# Patient Record
Sex: Female | Born: 1980 | Race: Black or African American | Hispanic: No | Marital: Single | Smoking: Former smoker
Health system: Southern US, Community
[De-identification: ages and names within clinical notes are randomized; demographics above are authoritative.]

## PROBLEM LIST (undated history)

## (undated) DIAGNOSIS — D649 Anemia, unspecified: Secondary | ICD-10-CM

## (undated) DIAGNOSIS — M419 Scoliosis, unspecified: Secondary | ICD-10-CM

## (undated) DIAGNOSIS — M549 Dorsalgia, unspecified: Secondary | ICD-10-CM

## (undated) HISTORY — PX: DILATION AND CURETTAGE OF UTERUS: SHX78

---

## 2014-05-16 ENCOUNTER — Encounter: Payer: Self-pay | Admitting: *Deleted

## 2014-05-16 ENCOUNTER — Emergency Department (INDEPENDENT_AMBULATORY_CARE_PROVIDER_SITE_OTHER)
Admission: EM | Admit: 2014-05-16 | Discharge: 2014-05-16 | Disposition: A | Discharge status: Home / Self Care | Payer: Self-pay | Source: Home / Self Care | Attending: Family Medicine | Admitting: Family Medicine

## 2014-05-16 ENCOUNTER — Telehealth: Payer: Self-pay | Admitting: *Deleted

## 2014-05-16 DIAGNOSIS — L03031 Cellulitis of right toe: Secondary | ICD-10-CM

## 2014-05-16 MED ORDER — HYDROCODONE-ACETAMINOPHEN 5-325 MG PO TABS
1.0000 | ORAL_TABLET | Freq: Four times a day (QID) | ORAL | Status: DC | PRN
Start: 2014-05-16 — End: 2014-06-05

## 2014-05-16 MED ORDER — MELOXICAM 15 MG PO TABS: 15.0000 mg | ORAL_TABLET | Freq: Every day | ORAL | Status: DC

## 2014-05-16 MED ORDER — DOXYCYCLINE HYCLATE 100 MG PO CAPS: 100.0000 mg | ORAL_CAPSULE | Freq: Two times a day (BID) | ORAL | Status: DC

## 2014-05-16 NOTE — Discharge Instructions (Signed)
Begin warm soaks 3 to 4 times daily.  Elevate foot whenever possible.   Paronychia  Paronychia is an infection of the skin caused by germs. It happens by the fingernail or toenail. You can avoid it by not:  Pulling on hangnails.  Nail biting.  Thumb sucking.  Cutting fingernails and toenails too short.  Cutting the skin at the base and sides of the fingernail or toenail (cuticle). HOME CARE  Keep the fingers or toes very dry. Put rubber gloves over cotton gloves when putting hands in water.  Keep the wound clean and bandaged (dressed) as told by your doctor.  Soak the fingers or toes in warm water for 15 to 20 minutes. Soak them 3 to 4 times per day for germ infections. Fungal infections are difficult to treat. Fungal infections often require treatment for a long time.  Only take medicine as told by your doctor. GET HELP RIGHT AWAY IF:   You have redness, puffiness (swelling), or pain that gets worse.  You see yellowish-white fluid (pus) coming from the wound.  You have a fever.  You have a bad smell coming from the wound or bandage. MAKE SURE YOU:  Understand these instructions.  Will watch your condition.  Will get help if you are not doing well or get worse. Document Released: 05/28/2009 Document Revised: 09/01/2011 Document Reviewed: 05/28/2009 [facility] Patient Information 2015 [place], [place]. This information is not intended to replace advice given to you by your health care provider. Make sure you discuss any questions you have with your health care provider.

## 2014-05-16 NOTE — ED Notes (Signed)
[facility] [place]

## 2014-05-16 NOTE — ED Provider Notes (Signed)
CSN: [id number]     Arrival date & time 05/16/14  1426 History   First MD Initiated Contact with Patient 05/16/14 1447     Chief Complaint  Patient presents with  . Nail Problem      HPI Comments: Patient complains of a one week history of a painful "ingrown" right great toenail with pain and swelling.  She had similar condition one year ago treated by partial excision of her toenail.  Patient is a 33 y.o. female presenting with toe pain. The history is provided by the patient.  Toe Pain This is a recurrent problem. Episode onset: one week ago. The problem occurs constantly. The problem has been gradually worsening. Associated symptoms comments: No fever. The symptoms are aggravated by walking. Nothing relieves the symptoms. She has tried nothing for the symptoms.    History reviewed. No pertinent past medical history. Past Surgical History  Procedure Laterality Date  . Dilation and curettage of uterus     History reviewed. No pertinent family history. History  Substance Use Topics  . Smoking status: Former Smoker    Types: Cigarettes    Quit date: 12/14/2013  . Smokeless tobacco: Never Used  . Alcohol Use: No   OB History    No data available     Review of Systems  All other systems reviewed and are negative.   Allergies  Tramadol  Home Medications   Prior to Admission medications   Medication Sig Start Date End Date Taking? Authorizing Provider  doxycycline (VIBRAMYCIN) 100 MG capsule Take 1 capsule (100 mg total) by mouth 2 (two) times daily. (Take with food) 05/16/14   Lattie HawStephen A Aadin Gaut, MD  HYDROcodone-acetaminophen (NORCO/VICODIN) 5-325 MG per tablet Take 1 tablet by mouth every 6 (six) hours as needed. May take two tabs at bedtime if needed. 05/16/14   Lattie HawStephen A Jazz Biddy, MD  meloxicam (MOBIC) 15 MG tablet Take 1 tablet (15 mg total) by mouth daily. Take with food each evening 05/16/14   Lattie HawStephen A Katharine Rochefort, MD   BP 116/75 mmHg  Pulse 88  Temp(Src) 98.1 F (36.7 C)  (Oral)  Ht 5\' 4"  (1.626 m)  Wt 113 lb (51.256 kg)  BMI 19.39 kg/m2  SpO2 100%  LMP 05/09/2014 Physical Exam  Constitutional: She is oriented to person, place, and time. She appears well-developed and well-nourished. No distress.  HENT:  Head: Normocephalic.  Eyes: Conjunctivae are normal. Pupils are equal, round, and reactive to light.  Musculoskeletal:       Right foot: There is tenderness and swelling. There is normal range of motion, no bony tenderness and normal capillary refill.       Feet:  Patient's right great toe has tenderness and swelling at the lateral base of the toenail.  Toenail is thickened and somewhat deformed, but no swelling or tenderness at the distal edges of nail.  Neurological: She is alert and oriented to person, place, and time.  Skin: Skin is warm and dry. No rash noted.  Nursing note and vitals reviewed.   ED Course  Procedures  none     MDM   1. Paronychia of great toe, right.  Doubt ingrown toenail.  Suspect onychomycosis also   Begin doxycycline and Mobic.  Lortab for pain. Begin warm soaks 3 to 4 times daily.  Elevate foot whenever possible. Followup with podiatrist if not resolved in one week.  Addendum: Call back from patient: unable to afford doxycycline. Will switch to Keflex 500mg  TID.    Tera MaterStephen A  Cathren HarshBeese, MD 05/16/14 1659

## 2014-05-16 NOTE — ED Notes (Signed)
Eileen LittenLakinya c/o ingrown toenail to right great toe x 1 week. C/o pain and swelling. Hx of nail removal 1 year ago.

## 2014-06-05 ENCOUNTER — Emergency Department (HOSPITAL_COMMUNITY)
Admission: EM | Admit: 2014-06-05 | Discharge: 2014-06-05 | Disposition: A | Discharge status: Home / Self Care | Payer: Medicaid Other | Attending: Emergency Medicine | Admitting: Emergency Medicine

## 2014-06-05 ENCOUNTER — Encounter (HOSPITAL_COMMUNITY): Payer: Self-pay | Admitting: Emergency Medicine

## 2014-06-05 DIAGNOSIS — Z87891 Personal history of nicotine dependence: Secondary | ICD-10-CM | POA: Diagnosis not present

## 2014-06-05 DIAGNOSIS — K088 Other specified disorders of teeth and supporting structures: Secondary | ICD-10-CM | POA: Diagnosis present

## 2014-06-05 DIAGNOSIS — Z791 Long term (current) use of non-steroidal anti-inflammatories (NSAID): Secondary | ICD-10-CM | POA: Diagnosis not present

## 2014-06-05 DIAGNOSIS — Z792 Long term (current) use of antibiotics: Secondary | ICD-10-CM | POA: Diagnosis not present

## 2014-06-05 DIAGNOSIS — G479 Sleep disorder, unspecified: Secondary | ICD-10-CM | POA: Diagnosis not present

## 2014-06-05 DIAGNOSIS — K0889 Other specified disorders of teeth and supporting structures: Secondary | ICD-10-CM

## 2014-06-05 MED ORDER — HYDROCODONE-ACETAMINOPHEN 5-325 MG PO TABS: 1.0000 | ORAL_TABLET | Freq: Four times a day (QID) | ORAL | Status: DC | PRN

## 2014-06-05 MED ORDER — PENICILLIN V POTASSIUM 500 MG PO TABS
500.0000 mg | ORAL_TABLET | Freq: Four times a day (QID) | ORAL | Status: DC
Start: 2014-06-05 — End: 2014-07-21

## 2014-06-05 NOTE — ED Notes (Signed)
Patient has a broken right bottom tooth. Right lower gums swollen and has tenderness to the right lateral neck. Patient denies any fever, difficulty breathing , or swallowing.

## 2014-06-05 NOTE — ED Provider Notes (Signed)
CSN: [id number]     Arrival date & time 06/05/14  [id number] History   First MD Initiated Contact with Patient 06/05/14 0940     Chief Complaint  Patient presents with  . Dental Pain     (Consider location/radiation/quality/duration/timing/severity/associated sxs/prior Treatment) HPI Comments: Patient presents to the emergency department with a dental complaint. Symptoms began 3 days ago. The patient has tried to alleviate pain with OTC meds.  Pain rated at a 10/10, characterized as throbbing in nature and located right lower molar. Patient denies fever, night sweats, chills, difficulty swallowing or opening mouth, SOB, nuchal rigidity or decreased ROM of neck.  Patient does not have a dentist and requests a resource guide at discharge.   The history is provided by the patient. No language interpreter was used.    History reviewed. No pertinent past medical history. Past Surgical History  Procedure Laterality Date  . Dilation and curettage of uterus     No family history on file. History  Substance Use Topics  . Smoking status: Former Smoker    Types: Cigarettes    Quit date: 12/14/2013  . Smokeless tobacco: Never Used  . Alcohol Use: No   OB History    No data available     Review of Systems  Constitutional: Negative for chills.  HENT: Positive for dental problem.   Neurological: Negative for speech difficulty.  Psychiatric/Behavioral: Positive for sleep disturbance.      Allergies  Tramadol  Home Medications   Prior to Admission medications   Medication Sig Start Date End Date Taking? Authorizing Provider  doxycycline (VIBRAMYCIN) 100 MG capsule Take 1 capsule (100 mg total) by mouth 2 (two) times daily. (Take with food) 05/16/14   Lattie HawStephen A Beese, MD  HYDROcodone-acetaminophen (NORCO/VICODIN) 5-325 MG per tablet Take 1 tablet by mouth every 6 (six) hours as needed for moderate pain or severe pain. 06/05/14   Roxy Horsemanobert Treysean Petruzzi, PA-C  meloxicam (MOBIC) 15 MG tablet Take  1 tablet (15 mg total) by mouth daily. Take with food each evening 05/16/14   Lattie HawStephen A Beese, MD  penicillin v potassium (VEETID) 500 MG tablet Take 1 tablet (500 mg total) by mouth 4 (four) times daily. 06/05/14   Roxy Horsemanobert Nyanna Heideman, PA-C   BP 108/79 mmHg  Pulse 98  Temp(Src) 98 F (36.7 C) (Oral)  Resp 16  SpO2 100%  LMP 05/09/2014 Physical Exam  Constitutional: She is oriented to person, place, and time. She appears well-developed and well-nourished.  HENT:  Head: Normocephalic and atraumatic.  Mouth/Throat:    Poor dentition throughout.  Affected tooth as diagrammed.  No signs of peritonsillar or tonsillar abscess.  No signs of gingival abscess. Oropharynx is clear and without exudates.  Uvula is midline.  Airway is intact. No signs of Ludwig's angina with palpation of oral and sublingual mucosa.   Eyes: Conjunctivae and EOM are normal.  Neck: Normal range of motion.  Cardiovascular: Normal rate.   Pulmonary/Chest: Effort normal.  Abdominal: She exhibits no distension.  Musculoskeletal: Normal range of motion.  Neurological: She is alert and oriented to person, place, and time.  Skin: Skin is dry.  Psychiatric: She has a normal mood and affect. Her behavior is normal. Judgment and thought content normal.  Nursing note and vitals reviewed.   ED Course  Procedures (including critical care time) Labs Review Labs Reviewed - No data to display  Imaging Review No results found.   EKG Interpretation None      MDM   Final diagnoses:  Pain, dental    Patient with toothache.  No gross abscess.  Exam unconcerning for Ludwig's angina or spread of infection.  Will treat with penicillin and pain medicine.  Urged patient to follow-up with dentist.      Roxy Horsemanobert Duanne Duchesne, PA-C 06/05/14 1012  Arby BarretteMarcy Pfeiffer, MD 06/06/14 [phone]

## 2014-06-05 NOTE — Discharge Instructions (Signed)

## 2014-06-05 NOTE — ED Notes (Signed)
Pt c/o right sided dental pain, has broken tooth.

## 2014-07-17 ENCOUNTER — Encounter: Payer: Self-pay | Admitting: Emergency Medicine

## 2014-07-17 ENCOUNTER — Emergency Department
Admission: EM | Admit: 2014-07-17 | Discharge: 2014-07-17 | Disposition: A | Discharge status: Home / Self Care | Payer: Medicaid Other | Source: Home / Self Care | Attending: Family Medicine | Admitting: Family Medicine

## 2014-07-17 DIAGNOSIS — L03031 Cellulitis of right toe: Secondary | ICD-10-CM

## 2014-07-17 MED ORDER — DOXYCYCLINE HYCLATE 100 MG PO CAPS: 100.0000 mg | ORAL_CAPSULE | Freq: Two times a day (BID) | ORAL | Status: DC

## 2014-07-17 MED ORDER — HYDROCODONE-ACETAMINOPHEN 5-325 MG PO TABS: ORAL_TABLET | ORAL | Status: DC

## 2014-07-17 NOTE — ED Provider Notes (Signed)
CSN: [id number]     Arrival date & time 07/17/14  1647 History   First MD Initiated Contact with Patient 07/17/14 1708     Chief Complaint  Patient presents with  . Nail Problem     HPI Comments: Patient complains of infection and pain in her right great toe and toenail for about five days.  She has had drainage, and increasing pain at night.  No fevers, chills, and sweats   Patient is a 34 y.o. female presenting with toe pain. The history is provided by the patient.  Toe Pain This is a recurrent problem. Episode onset: 5 days ago. The problem occurs constantly. The problem has been gradually worsening. Associated symptoms comments: No fever. The symptoms are aggravated by walking. Nothing relieves the symptoms. Treatments tried: Ibuprofen. The treatment provided mild relief.    History reviewed. No pertinent past medical history. Past Surgical History  Procedure Laterality Date  . Dilation and curettage of uterus     No family history on file. History  Substance Use Topics  . Smoking status: Former Smoker    Types: Cigarettes    Quit date: 12/14/2013  . Smokeless tobacco: Never Used  . Alcohol Use: No   OB History    No data available     Review of Systems  All other systems reviewed and are negative.   Allergies  Tramadol  Home Medications   Prior to Admission medications   Medication Sig Start Date End Date Taking? Authorizing Provider  doxycycline (VIBRAMYCIN) 100 MG capsule Take 1 capsule (100 mg total) by mouth 2 (two) times daily. (Take with food) 07/17/14   Lattie Haw, MD  HYDROcodone-acetaminophen (NORCO/VICODIN) 5-325 MG per tablet Take one to two tabs PO at bedtime prn pain 07/17/14   Lattie Haw, MD  meloxicam (MOBIC) 15 MG tablet Take 1 tablet (15 mg total) by mouth daily. Take with food each evening 05/16/14   Lattie Haw, MD  penicillin v potassium (VEETID) 500 MG tablet Take 1 tablet (500 mg total) by mouth 4 (four) times daily. 06/05/14    Roxy Horseman, PA-C   BP 117/79 mmHg  Pulse 97  Temp(Src) 98 F (36.7 C) (Oral)  Ht  (1.626 m)  Wt 110 lb (49.896 kg)  BMI 18.87 kg/m2  SpO2 99% Physical Exam  Constitutional: She is oriented to person, place, and time. She appears well-developed and well-nourished.  Eyes: Pupils are equal, round, and reactive to light.  Musculoskeletal:       Right foot: There is tenderness and swelling. There is normal range of motion, no bony tenderness and normal capillary refill.       Feet:  Right great toe distal phalanx is tender to palpation.  There is a 5mm by 7mm shallow ulcer at lateral edge of great toenail with small amount of purulent drainage.  There is a small amount of drainage from the medial side of toenail.  Distal neurovascular function is intact. No tenderness proximal phalanx or MTP joint.  Neurological: She is alert and oriented to person, place, and time.  Skin: Skin is warm and dry.  Nursing note and vitals reviewed.   ED Course  Procedures  none    Labs Reviewed  WOUND CULTURE          MDM   1. Paronychia of great toe, right    Wound culture pending.  Begin doxycycline for staph coverage.  Lortab for pain at night.  May continue warm soaks  two or three times daily.  Keep toe bandaged and change bandage daily.  May continue Ibuprofen 200mg , 4 tabs every 8 hours with food.  Followup with podiatrist if not improved one week, or earlier if symptoms worsen.    Lattie HawStephen A Beese, MD 07/17/14 1739

## 2014-07-17 NOTE — Discharge Instructions (Signed)
May continue warm soaks two or three times daily.  Keep toe bandaged and change bandage daily.  May continue Ibuprofen 200mg , 4 tabs every 8 hours with food.    Paronychia Paronychia is an inflammatory reaction involving the folds of the skin surrounding the fingernail. This is commonly caused by an infection in the skin around a nail. The most common cause of paronychia is frequent wetting of the hands (as seen with bartenders, food servers, nurses or others who wet their hands). This makes the skin around the fingernail susceptible to infection by bacteria (germs) or fungus. Other predisposing factors are:  Aggressive manicuring.  Nail biting.  Thumb sucking. The most common cause is a staphylococcal (a type of germ) infection, or a fungal (Candida) infection. When caused by a germ, it usually comes on suddenly with redness, swelling, pus and is often painful. It may get under the nail and form an abscess (collection of pus), or form an abscess around the nail. If the nail itself is infected with a fungus, the treatment is usually prolonged and may require oral medicine for up to one year. Your caregiver will determine the length of time treatment is required. The paronychia caused by bacteria (germs) may largely be avoided by not pulling on hangnails or picking at cuticles. When the infection occurs at the tips of the finger it is called felon. When the cause of paronychia is from the herpes simplex virus (HSV) it is called herpetic whitlow. TREATMENT  When an abscess is present treatment is often incision and drainage. This means that the abscess must be cut open so the pus can get out. When this is done, the following home care instructions should be followed. HOME CARE INSTRUCTIONS   It is important to keep the affected fingers very dry. Rubber or plastic gloves over cotton gloves should be used whenever the hand must be placed in water.  Keep wound clean, dry and dressed as suggested by your  caregiver between warm soaks or warm compresses.  Soak in warm water for fifteen to twenty minutes three to four times per day for bacterial infections. Fungal infections are very difficult to treat, so often require treatment for long periods of time.  For bacterial (germ) infections take antibiotics (medicine which kill germs) as directed and finish the prescription, even if the problem appears to be solved before the medicine is gone.  Only take over-the-counter or prescription medicines for pain, discomfort, or fever as directed by your caregiver. SEEK IMMEDIATE MEDICAL CARE IF:  You have redness, swelling, or increasing pain in the wound.  You notice pus coming from the wound.  You have a fever.  You notice a bad smell coming from the wound or dressing. Document Released: 12/03/2000 Document Revised: 09/01/2011 Document Reviewed: 08/04/2008 [facility] Patient Information 2015 [place], [place]. This information is not intended to replace advice given to you by your health care provider. Make sure you discuss any questions you have with your health care provider.

## 2014-07-17 NOTE — ED Notes (Signed)
Rt great toe infection x 5 days

## 2014-07-20 LAB — WOUND CULTURE: Gram Stain: NONE SEEN

## 2014-07-21 ENCOUNTER — Emergency Department (INDEPENDENT_AMBULATORY_CARE_PROVIDER_SITE_OTHER): Payer: Medicaid Other

## 2014-07-21 ENCOUNTER — Emergency Department (INDEPENDENT_AMBULATORY_CARE_PROVIDER_SITE_OTHER)
Admission: EM | Admit: 2014-07-21 | Discharge: 2014-07-21 | Disposition: A | Discharge status: Home / Self Care | Payer: Medicaid Other | Source: Home / Self Care | Attending: Family Medicine | Admitting: Family Medicine

## 2014-07-21 ENCOUNTER — Ambulatory Visit (INDEPENDENT_AMBULATORY_CARE_PROVIDER_SITE_OTHER): Payer: Medicaid Other | Admitting: Sports Medicine

## 2014-07-21 ENCOUNTER — Encounter: Payer: Self-pay | Admitting: *Deleted

## 2014-07-21 DIAGNOSIS — L03031 Cellulitis of right toe: Secondary | ICD-10-CM

## 2014-07-21 DIAGNOSIS — Q843 Anonychia: Secondary | ICD-10-CM

## 2014-07-21 DIAGNOSIS — Q846 Other congenital malformations of nails: Secondary | ICD-10-CM | POA: Insufficient documentation

## 2014-07-21 DIAGNOSIS — M25571 Pain in right ankle and joints of right foot: Secondary | ICD-10-CM

## 2014-07-21 MED ORDER — SULFAMETHOXAZOLE-TRIMETHOPRIM 800-160 MG PO TABS: 1.0000 | ORAL_TABLET | Freq: Two times a day (BID) | ORAL | Status: DC

## 2014-07-21 MED ORDER — MELOXICAM 15 MG PO TABS: 15.0000 mg | ORAL_TABLET | Freq: Every day | ORAL | Status: DC

## 2014-07-21 MED ORDER — HYDROCODONE-ACETAMINOPHEN 5-325 MG PO TABS
ORAL_TABLET | ORAL | Status: DC
Start: 2014-07-21 — End: 2014-07-29

## 2014-07-21 MED ORDER — DOXYCYCLINE HYCLATE 100 MG PO CAPS: 100.0000 mg | ORAL_CAPSULE | Freq: Two times a day (BID) | ORAL | Status: DC

## 2014-07-21 NOTE — ED Provider Notes (Signed)
CSN: [id number]     Arrival date & time 07/21/14  0831 History   First MD Initiated Contact with Patient 07/21/14 0912     Chief Complaint  Patient presents with  . Nail Problem     HPI Comments: Patient was here four days ago for treatment of paronychia right great toe.  She was started on doxycycline and culture grew staph (not MRSA).  She complains of persistent pain and drainage from the toe, and has pain with weight bearing.  No fevers, chills, and sweats.  Patient is a 34 y.o. female presenting with toe pain. The history is provided by the patient.  Toe Pain This is a recurrent problem. The current episode started more than 1 week ago. The problem occurs constantly. The problem has been gradually worsening. Associated symptoms comments: No fever. The symptoms are aggravated by walking. Nothing relieves the symptoms. Treatments tried: Doxycycline. The treatment provided no relief.    History reviewed. No pertinent past medical history. Past Surgical History  Procedure Laterality Date  . Dilation and curettage of uterus     History reviewed. No pertinent family history. History  Substance Use Topics  . Smoking status: Former Smoker    Types: Cigarettes    Quit date: 12/14/2013  . Smokeless tobacco: Never Used  . Alcohol Use: No   OB History    No data available     Review of Systems  Constitutional: Negative for fever, chills and fatigue.  Cardiovascular: Negative.   Gastrointestinal: Negative.   Genitourinary: Negative.   Musculoskeletal: Positive for joint swelling.  Skin:       Persistent pain/drainage right great toenail    Allergies  Tramadol  Home Medications   Prior to Admission medications   Medication Sig Start Date End Date Taking? Authorizing Provider  doxycycline (VIBRAMYCIN) 100 MG capsule Take 1 capsule (100 mg total) by mouth 2 (two) times daily. (Take with food) 07/21/14   Lattie Haw, MD  HYDROcodone-acetaminophen (NORCO/VICODIN) 5-325 MG per  tablet Take one to two tabs PO at bedtime prn pain 07/21/14   Lattie Haw, MD  meloxicam (MOBIC) 15 MG tablet Take 1 tablet (15 mg total) by mouth daily. Take with food each evening 07/21/14   Lattie Haw, MD  sulfamethoxazole-trimethoprim (BACTRIM DS,SEPTRA DS) 800-160 MG per tablet Take 1 tablet by mouth 2 (two) times daily. 07/21/14   Lattie Haw, MD   BP 122/88 mmHg  Pulse 91  Temp(Src) 98.5 F (36.9 C) (Oral)  Resp 14  SpO2 100%  LMP 07/10/2014 Physical Exam  Constitutional: She is oriented to person, place, and time. She appears well-developed and well-nourished. No distress.  HENT:  Head: Normocephalic.  Eyes: Pupils are equal, round, and reactive to light.  Musculoskeletal:       Right foot: There is tenderness and swelling. There is normal capillary refill.       Feet:  Right great toe has tenderness and swelling over the IP joint with decreased range of motion.  There is persistent purulent drainage from both edges of toenail, and fluctuance over the eponychium.  Previously noted ulceration on lateral edge has resolved.  Neurological: She is alert and oriented to person, place, and time.  Skin: Skin is warm and dry. No rash noted.  Nursing note and vitals reviewed.   ED Course  Procedures  Incise and drain eponychia right great toe. Risks and benefits of procedure explained to patient and verbal consent obtained.  Using sterile technique and  digital anesthesia with 0.05% bupivacaine without epinephrine, cleansed affected area with Betadine and alcohol Made two incisions into the eponychium at proximal nail fold, medially and laterally.  Expressed blood and minimal purulent material.  Applied Xeroform gauze followed by light compression dressing.  Patient tolerated well       Imaging Review Dg Toe Great Right  07/21/2014   CLINICAL DATA:  Discoloration at nail, draining for 2 days, pain, question ingrown toenail  EXAM: RIGHT GREAT TOE  COMPARISON:  None  FINDINGS:  Osseous mineralization normal.  Joint spaces preserved.  No fracture, dislocation, or bone destruction.  No soft tissue gas identified.  IMPRESSION: No acute osseous abnormalities.   Electronically Signed   By: Ulyses SouthwardMark  Boles M.D.   On: 07/21/2014 09:14     MDM   1. Paronychia of great toe, right   2. Eponychia of right great toe.   Consulted with Dr. Rodney Langtonhomas Thekkekandam who ruled out septic first IP joint by ultrasound. Wound culture pending.  Continue doxycline.  Add double coverage with Bactrim DS one BID Continue Mobic 15mg  daytime, and Lortab at bedtime. Leave present bandage in place until follow-up tomorrow.  Elevate leg whenever possible. Return tomorrow    Lattie HawStephen A Ina Poupard, MD 07/21/14 [phone]

## 2014-07-21 NOTE — Progress Notes (Signed)
   Subjective:    I'm seeing this patient as a consultation for:  Dr. Cathren HarshBeese  CC: right toe swelling  HPI: This is a very pleasant 34 year old female, [place] a partial nail plate removal for an ingrown toenail, she has been having worsening swelling and pain with purulence draining from the wound.symptoms are moderate, persistent. Because there was some pain and swelling over the interphalangeal joint I was called for further evaluation as to whether this could represent a septic joint. Patient has pain predominantly at the site of the nail plate excision, but does not have much pain referable specifically to the interphalangeal joint. She is neurovascularly intact distally. No constitutional symptoms. She is able to move the toe without any pain referable to the joint itself.  Past medical history, Surgical history, Family history not pertinant except as noted below, Social history, Allergies, and medications have been entered into the medical record, reviewed, and no changes needed.   Review of Systems: No headache, visual changes, nausea, vomiting, diarrhea, constipation, dizziness, abdominal pain, skin rash, fevers, chills, night sweats, weight loss, swollen lymph nodes, body aches, joint swelling, muscle aches, chest pain, shortness of breath, mood changes, visual or auditory hallucinations.   Objective:   General: Well Developed, well nourished, and in no acute distress.  Neuro/Psych: Alert and oriented x3, extra-ocular muscles intact, able to move all 4 extremities, sensation grossly intact. Skin: Warm and dry, no rashes noted.  Respiratory: Not using accessory muscles, speaking in full sentences, trachea midline.  Cardiovascular: Pulses palpable, no extremity edema. Abdomen: Does not appear distended. Right Foot: Noted site of the lateral nail plate excision, there is some purulence and swelling in the eponychium. She does not have any discrete tenderness to  palpation over the extensor hallucis longus tendon or the interphalangeal joint itself, and I am able to move the joint passively without much pain. Range of motion is full in all directions. Strength is 5/5 in all directions. No hallux valgus. No pes cavus or pes planus. No abnormal callus noted. No pain over the navicular prominence, or base of fifth metatarsal. No tenderness to palpation of the calcaneal insertion of plantar fascia. No pain at the Achilles insertion. No pain over the calcaneal bursa. No pain of the retrocalcaneal bursa. No tenderness to palpation over the tarsals, metatarsals, or phalanges. No hallux rigidus or limitus. No tenderness palpation over interphalangeal joints. No pain with compression of the metatarsal heads. Neurovascularly intact distally.  Procedure: Diagnostic Ultrasound of  Right great toe Device: GE Logiq E  Findings:hypoechoic change at the proximal nail plate, suggestive of eponychia, some of this hypoechoic change does appear to extend over the extensor hallucis longus tendon sheath, but I do not see any sign of effusion in the interphalangeal joint itself, from a dorsal view, lateral view or a plantar view. The flexor hallucis longus tendon also appears normal without tenosynovitis. Images permanently stored and available for review in the ultrasound unit.  Impression: Eponychia with mild adjacent reactive extensor hallucis longus tendinitis without evidence of septic 1st interphalangeal joint.  Impression and Recommendations:   This case required medical decision making of moderate complexity.

## 2014-07-21 NOTE — ED Notes (Signed)
Right great toenail infection not improving. Increased drainge and pain. Denies fever/chills.

## 2014-07-21 NOTE — Discharge Instructions (Signed)
Leave present bandage in place until follow-up tomorrow.  Elevate leg whenever possible.

## 2014-07-21 NOTE — Assessment & Plan Note (Addendum)
Right great toenail post lateral nail plate [place] one [place] ago. She did develop an infection and has been on doxycycline. Cultures were growing out coagulase-negative Staphylococcus suggestive of contaminant. Ultrasound did show significant infection around the proximal nail plate , possibly involving the extensor hallucis longus tendon however there was no effusion at the interphalangeal joint to suggest a septic joint. I do recommend either removal of the entire nail or eponychia drainage followed by  Double coverage with doxycycline and Septra. I'm happy to see her for follow-up.  Final diagnosis is eponychia with mild adjacent reactive extensor hallucis longus tendinitis without evidence of septic 1st interphalangeal joint.

## 2014-07-24 ENCOUNTER — Telehealth: Payer: Self-pay | Admitting: *Deleted

## 2014-07-27 LAB — WOUND CULTURE
GRAM STAIN: NONE SEEN
GRAM STAIN: NONE SEEN

## 2014-07-29 ENCOUNTER — Emergency Department (INDEPENDENT_AMBULATORY_CARE_PROVIDER_SITE_OTHER)
Admission: EM | Admit: 2014-07-29 | Discharge: 2014-07-29 | Disposition: A | Discharge status: Home / Self Care | Payer: Medicaid Other | Source: Home / Self Care | Attending: Family Medicine | Admitting: Family Medicine

## 2014-07-29 ENCOUNTER — Emergency Department (INDEPENDENT_AMBULATORY_CARE_PROVIDER_SITE_OTHER): Payer: Medicaid Other

## 2014-07-29 DIAGNOSIS — W2201XA Walked into wall, initial encounter: Secondary | ICD-10-CM

## 2014-07-29 DIAGNOSIS — Q843 Anonychia: Secondary | ICD-10-CM

## 2014-07-29 DIAGNOSIS — S92911A Unspecified fracture of right toe(s), initial encounter for closed fracture: Secondary | ICD-10-CM

## 2014-07-29 DIAGNOSIS — Q846 Other congenital malformations of nails: Secondary | ICD-10-CM

## 2014-07-29 DIAGNOSIS — S92524A Nondisplaced fracture of medial phalanx of right lesser toe(s), initial encounter for closed fracture: Secondary | ICD-10-CM

## 2014-07-29 MED ORDER — DICLOFENAC SODIUM 50 MG PO TBEC: 50.0000 mg | DELAYED_RELEASE_TABLET | Freq: Two times a day (BID) | ORAL | Status: AC | PRN

## 2014-07-29 MED ORDER — SULFAMETHOXAZOLE-TRIMETHOPRIM 800-160 MG PO TABS: 2.0000 | ORAL_TABLET | Freq: Two times a day (BID) | ORAL | Status: DC

## 2014-07-29 NOTE — Discharge Instructions (Signed)
Thank you for coming in today. Use the shoe as needed. Continue buddy taping for at least 2 or 3 weeks. Take diclofenac for pain control as needed. Take Bactrim twice daily for toe infection. Follow-up with podiatry. If you cannot schedule an appointment please call me (Dr. Denyse Amassorey) at [phone] on Monday from 8-2pm   Buddy Taping of Toes We have taped your toes together to keep them from moving. This is called "buddy taping" since we used a part of your own body to keep the injured part still. We placed soft padding between your toes to keep them from rubbing against each other. Buddy taping will help with healing and to reduce pain. Keep your toes buddy taped together for as long as directed by your caregiver. HOME CARE INSTRUCTIONS   Raise your injured area above the level of your heart while sitting or lying down. Prop it up with pillows.  An ice pack used every twenty minutes, while awake, for the first one to two days may be helpful. Put ice in a plastic bag and put a towel between the bag and your skin.  Watch for signs that the taping is too tight. These signs may be:  Numbness of your taped toes.  Coolness of your taped toes.  Color change in the area beyond the tape.  Increased pain.  If you have any of these signs, loosen or rewrap the tape. If you need to loosen or rewrap the buddy tape, make sure you use the padding again. SEEK IMMEDIATE MEDICAL CARE IF:   You have worse pain, swelling, inflammation (soreness), drainage or bleeding after you rewrap the tape.  Any new problems occur. MAKE SURE YOU:   Understand these instructions.  Will watch your condition.  Will get help right away if you are not doing well or get worse. Document Released: 03/13/2004 Document Revised: 09/01/2011 Document Reviewed: 06/06/2008 [facility] Patient Information 2015 [place], [place]. This information is not intended to replace advice given to you by your health care provider. Make sure  you discuss any questions you have with your health care provider.

## 2014-07-29 NOTE — ED Provider Notes (Signed)
Eileen Howe is a 34 y.o. female who presents to Urgent Care today for left great toe pain. Patient has had redness and tenderness at the proximal lateral nail border of the left greatparonychia. toe. This is been present for a few days. She is concerned the toe has become ingrown. She additionally notes pain in her right foot when she stepped her fourth and fifth toes last night. She suspects that she broke her toes. She has not tried any medications yet. No fevers or chills.   No past medical history on file. Past Surgical History  Procedure Laterality Date  . Dilation and curettage of uterus     History  Substance Use Topics  . Smoking status: Former Smoker    Types: Cigarettes    Quit date: 12/14/2013  . Smokeless tobacco: Never Used  . Alcohol Use: No   ROS as above Medications: No current facility-administered medications for this encounter.   Current Outpatient Prescriptions  Medication Sig Dispense Refill  . diclofenac (VOLTAREN) 50 MG EC tablet Take 1 tablet (50 mg total) by mouth 2 (two) times daily as needed for moderate pain. 60 tablet 0  . sulfamethoxazole-trimethoprim (SEPTRA DS) 800-160 MG per tablet Take 2 tablets by mouth 2 (two) times daily. 28 tablet 0   Allergies  Allergen Reactions  . Tramadol Nausea Only     Exam:  BP 118/77 mmHg  Pulse 92  Temp(Src) 98.3 F (36.8 C) (Oral)  Ht 5\' 4"  (1.626 m)  Wt 118 lb (53.524 kg)  BMI 20.24 kg/m2  SpO2 100%  LMP 07/17/2014 Gen: Well NAD Left great toe. Erythematous and tender without fluctuance or induration proximal lateral nail border. The toe is not ingrown  Right foot mildly swollen and tender fourth and fifth toes. Pulses capillary refill and sensation are intact bilaterally.  No results found for this or any previous visit (from the past 24 hour(s)). Dg Foot Complete Right  07/29/2014   CLINICAL DATA:  Injured the right 4th and 5th toes yesterday by striking them against the wall. Initial encounter.   EXAM: RIGHT FOOT COMPLETE - 3+ VIEW  COMPARISON:  None.  FINDINGS: Nondisplaced fracture involving the medial base of the middle phalanx of the 5th toe. No other fractures. Well preserved joint spaces. Well preserved bone mineral density. No other intrinsic osseous abnormality.  IMPRESSION: Nondisplaced fracture involving the medial base of the middle phalanx of 5th toe.   Electronically Signed   By: Hulan Saashomas  Lawrence M.D.   On: 07/29/2014 13:58    Assessment and Plan: 34 y.o. female with  1) Right toe fracture. Plan for buddy tape and post op shoe. Voltaren for pain control.   2) Paronychia: Not yet drain able. Tx with bactrim. F/u with podiatry.   Discussed warning signs or symptoms. Please see discharge instructions. Patient expresses understanding.     Rodolph BongEvan S Corey, MD 07/29/14 [id number]

## 2014-07-29 NOTE — ED Notes (Signed)
Patient c/o possible infection/ingrown toenail in left foot sx started 2-3 days ago, also states that she was playing with her child and hit her foot on the bottom of the couch yesterday and thinks she may have broken 4th and 5th toes.

## 2014-07-31 ENCOUNTER — Telehealth: Payer: Self-pay | Admitting: *Deleted

## 2014-08-18 ENCOUNTER — Ambulatory Visit: Payer: Medicaid Other | Admitting: Podiatry

## 2014-10-07 ENCOUNTER — Encounter: Payer: Self-pay | Admitting: Emergency Medicine

## 2014-10-07 ENCOUNTER — Emergency Department (INDEPENDENT_AMBULATORY_CARE_PROVIDER_SITE_OTHER)
Admission: EM | Admit: 2014-10-07 | Discharge: 2014-10-07 | Disposition: A | Discharge status: Home / Self Care | Payer: Medicaid Other | Source: Home / Self Care | Attending: Family Medicine | Admitting: Family Medicine

## 2014-10-07 DIAGNOSIS — M546 Pain in thoracic spine: Secondary | ICD-10-CM

## 2014-10-07 DIAGNOSIS — M545 Low back pain, unspecified: Secondary | ICD-10-CM

## 2014-10-07 HISTORY — DX: Dorsalgia, unspecified: M54.9

## 2014-10-07 HISTORY — DX: Scoliosis, unspecified: M41.9

## 2014-10-07 MED ORDER — METAXALONE 800 MG PO TABS: 800.0000 mg | ORAL_TABLET | Freq: Three times a day (TID) | ORAL | Status: DC

## 2014-10-07 MED ORDER — LIDOCAINE 5 % EX PTCH: MEDICATED_PATCH | CUTANEOUS | Status: DC

## 2014-10-07 NOTE — ED Provider Notes (Signed)
CSN: [id number]     Arrival date & time 10/07/14  1711 History   First MD Initiated Contact with Patient 10/07/14 1741     Chief Complaint  Patient presents with  . Back Pain      HPI Comments: Patient reports moving furniture yesterday and felt back muscle soreness afterwards; she has experienced this in past. She states that she has scoliosis and is more susceptible to back muscle strains.      Patient is a 34 y.o. female presenting with back pain. The history is provided by the patient.  Back Pain Location:  Thoracic spine and lumbar spine Quality:  Aching Radiates to:  Does not radiate Pain severity:  Moderate Pain is:  Same all the time Onset quality:  Sudden Duration:  1 day Timing:  Constant Progression:  Unchanged Chronicity:  Recurrent Context comment:  Lifting furniture Relieved by:  Nothing Worsened by:  Movement Ineffective treatments:  NSAIDs Associated symptoms: no abdominal pain, no bladder incontinence, no bowel incontinence, no chest pain, no dysuria, no fever, no leg pain, no numbness, no paresthesias, no pelvic pain, no perianal numbness, no tingling and no weakness     Past Medical History  Diagnosis Date  . Scoliosis   . Back pain    Past Surgical History  Procedure Laterality Date  . Dilation and curettage of uterus     No family history on file. History  Substance Use Topics  . Smoking status: Former Smoker    Types: Cigarettes    Quit date: 12/14/2013  . Smokeless tobacco: Never Used  . Alcohol Use: No   OB History    No data available     Review of Systems  Constitutional: Negative for fever.  Cardiovascular: Negative for chest pain.  Gastrointestinal: Negative for abdominal pain and bowel incontinence.  Genitourinary: Negative for bladder incontinence, dysuria and pelvic pain.  Musculoskeletal: Positive for back pain.  Neurological: Negative for tingling, weakness, numbness and paresthesias.  All other systems reviewed and are  negative.   Allergies  Tramadol  Home Medications   Prior to Admission medications   Medication Sig Start Date End Date Taking? Authorizing Provider  diclofenac (VOLTAREN) 50 MG EC tablet Take 1 tablet (50 mg total) by mouth 2 (two) times daily as needed for moderate pain. 07/29/14   Rodolph Bong, MD  lidocaine (LIDODERM) 5 % Apply 1 to 2 patches at bedtime.  Remove & Discard patch within 12 hours 10/07/14   Lattie Haw, MD  metaxalone (SKELAXIN) 800 MG tablet Take 1 tablet (800 mg total) by mouth 3 (three) times daily. 10/07/14   Lattie Haw, MD   BP 114/80 mmHg  Pulse 98  Temp(Src) 98.1 F (36.7 C) (Oral)  Ht  (1.626 m)  Wt 110 lb (49.896 kg)  BMI 18.87 kg/m2  SpO2 100% Physical Exam  Constitutional: She is oriented to person, place, and time. She appears well-developed and well-nourished. No distress.  HENT:  Head: Normocephalic.  Eyes: Pupils are equal, round, and reactive to light.  Neck: Normal range of motion.  Cardiovascular: Normal heart sounds.   Pulmonary/Chest: Breath sounds normal.  Abdominal: There is no tenderness.  Musculoskeletal:       Lumbar back: She exhibits decreased range of motion, tenderness and pain. She exhibits no bony tenderness and no swelling.       Back:  Patient exhibits an exaggerated pain response to minimal palpation and movement. She appears to have pain/tendeness in distribution  as  noted on diagram. Back:  Can heel/toe walk and squat without difficulty.  Decreased forward flexion.  Straight leg raising test is negative.  Sitting knee extension test is negative.  Strength and sensation in the lower extremities is normal.  Patellar and achilles reflexes are normal   Neurological: She is alert and oriented to person, place, and time.  Skin: Skin is warm and dry. No rash noted.  Nursing note and vitals reviewed.   ED Course  Procedures  none  MDM   1. Bilateral thoracic back pain   2. Bilateral low back pain without sciatica     Begin Lidoderm patch at bedtime (take off after 12 hours); Skelaxin 800mg  TID.  Note that patient already has Rx for Norco at home. Apply ice pack for 20 to 30 minutes, 3 to 4 times daily  Continue until pain decreases.  Begin back exercises when improved..  Followup with Dr. Rodney Langtonhomas Thekkekand[facility] ([facility]) if not improving about two weeks.     Lattie HawStephen A Beese, MD 10/14/14 [phone]

## 2014-10-07 NOTE — Discharge Instructions (Signed)
Apply ice pack for 20 to 30 minutes, 3 to 4 times daily  Continue until pain decreases.  ° ° °Back Pain, Adult °Low back pain is very common. About 1 in 5 people have back pain. The cause of low back pain is rarely dangerous. The pain often gets better over time. About half of people with a sudden onset of back pain feel better in just 2 weeks. About 8 in 10 people feel better by 6 weeks.  °CAUSES °Some common causes of back pain include: °· Strain of the muscles or ligaments supporting the spine. °· Wear and tear (degeneration) of the spinal discs. °· Arthritis. °· Direct injury to the back. °DIAGNOSIS °Most of the time, the direct cause of low back pain is not known. However, back pain can be treated effectively even when the exact cause of the pain is unknown. Answering your caregiver's questions about your overall health and symptoms is one of the most accurate ways to make sure the cause of your pain is not dangerous. If your caregiver needs more information, he or she may order lab work or imaging tests (X-rays or MRIs). However, even if imaging tests show changes in your back, this usually does not require surgery. °HOME CARE INSTRUCTIONS °For many people, back pain returns. Since low back pain is rarely dangerous, it is often a condition that people can learn to manage on their own.  °· Remain active. It is stressful on the back to sit or stand in one place. Do not sit, drive, or stand in one place for more than 30 minutes at a time. Take short walks on level surfaces as soon as pain allows. Try to increase the length of time you walk each day. °· Do not stay in bed. Resting more than 1 or 2 days can delay your recovery. °· Do not avoid exercise or work. Your body is made to move. It is not dangerous to be active, even though your back may hurt. Your back will likely heal faster if you return to being active before your pain is gone. °· Pay attention to your body when you  bend and lift. Many people have  less discomfort when lifting if they bend their knees, keep the load close to their bodies, and avoid twisting. Often, the most comfortable positions are those that put less stress on your recovering back. °· Find a comfortable position to sleep. Use a firm mattress and lie on your side with your knees slightly bent. If you lie on your back, put a pillow under your knees. °· Only take over-the-counter or prescription medicines as directed by your caregiver. Over-the-counter medicines to reduce pain and inflammation are often the most helpful. Your caregiver may prescribe muscle relaxant drugs. These medicines help dull your pain so you can more quickly return to your normal activities and healthy exercise. °· Put ice on the injured area. °¨ Put ice in a plastic bag. °¨ Place a towel between your skin and the bag. °¨ Leave the ice on for 15-20 minutes, 03-04 times a day for the first 2 to 3 days. After that, ice and heat may be alternated to reduce pain and spasms. °· Ask your caregiver about trying back exercises and gentle massage. This may be of some benefit. °· Avoid feeling anxious or stressed. Stress increases muscle tension and can worsen back pain. It is important to recognize when you are anxious or stressed and learn ways to manage it. Exercise is a great option. °SEEK MEDICAL CARE IF: °· You have pain that is not relieved with rest or medicine. °· You have   pain that does not improve in 1 week.  You have new symptoms.  You are generally not feeling well. SEEK IMMEDIATE MEDICAL CARE IF:   You have pain that radiates from your back into your legs.  You develop new bowel or bladder control problems.  You have unusual weakness or numbness in your arms or legs.  You develop nausea or vomiting.  You develop abdominal pain.  You feel faint. Document Released: 06/09/2005 Document Revised: 12/09/2011 Document Reviewed: 10/11/2013 [facility] Patient Information 2015 [place]Care, [place]. This information  is not intended to replace advice given to you by your health care provider. Make sure you discuss any questions you have with your health care provider.    Back Exercises Back exercises help treat and prevent back injuries. The goal of back exercises is to increase the strength of your abdominal and back muscles and the flexibility of your back. These exercises should be started when you no longer have back pain. Back exercises include:  Pelvic Tilt. Lie on your back with your knees bent. Tilt your pelvis until the lower part of your back is against the floor. Hold this position 5 to 10 sec and repeat 5 to 10 times.  Knee to Chest. Pull first 1 knee up against your chest and hold for 20 to 30 seconds, repeat this with the other knee, and then both knees. This may be done with the other leg straight or bent, whichever feels better.  Sit-Ups or Curl-Ups. Bend your knees 90 degrees. Start with tilting your pelvis, and do a partial, slow sit-up, lifting your trunk only 30 to 45 degrees off the floor. Take at least 2 to 3 seconds for each sit-up. Do not do sit-ups with your knees out straight. If partial sit-ups are difficult, simply do the above but with only tightening your abdominal muscles and holding it as directed.  Hip-Lift. Lie on your back with your knees flexed 90 degrees. Push down with your feet and shoulders as you raise your hips a couple inches off the floor; hold for 10 seconds, repeat 5 to 10 times.  Back arches. Lie on your stomach, propping yourself up on bent elbows. Slowly press on your hands, causing an arch in your low back. Repeat 3 to 5 times. Any initial stiffness and discomfort should lessen with repetition over time.  Shoulder-Lifts. Lie face down with arms beside your body. Keep hips and torso pressed to floor as you slowly lift your head and shoulders off the floor. Do not overdo your exercises, especially in the beginning. Exercises may cause you some mild back discomfort  which lasts for a few minutes; however, if the pain is more severe, or lasts for more than 15 minutes, do not continue exercises until you see your caregiver. Improvement with exercise therapy for back problems is slow.  See your caregivers for assistance with developing a proper back exercise program. Document Released: 07/17/2004 Document Revised: 09/01/2011 Document Reviewed: 04/10/2011 [facility] Patient Information 2015 [place], [place]. This information is not intended to replace advice given to you by your health care provider. Make sure you discuss any questions you have with your health care provider.

## 2014-10-07 NOTE — ED Notes (Signed)
Patient reports moving furniture yesterday and felt back muscle soreness afterwards; has experienced this in past. States she has scoliosis and is more susceptible to back muscle strains.

## 2014-10-14 ENCOUNTER — Telehealth: Payer: Self-pay | Admitting: Emergency Medicine

## 2014-10-15 ENCOUNTER — Telehealth: Payer: Self-pay

## 2014-10-15 ENCOUNTER — Emergency Department (INDEPENDENT_AMBULATORY_CARE_PROVIDER_SITE_OTHER)
Admission: EM | Admit: 2014-10-15 | Discharge: 2014-10-15 | Disposition: A | Discharge status: Home / Self Care | Payer: Medicaid Other | Source: Home / Self Care | Attending: Family Medicine | Admitting: Family Medicine

## 2014-10-15 ENCOUNTER — Encounter: Payer: Self-pay | Admitting: Emergency Medicine

## 2014-10-15 DIAGNOSIS — M545 Low back pain: Secondary | ICD-10-CM

## 2014-10-15 DIAGNOSIS — M546 Pain in thoracic spine: Secondary | ICD-10-CM | POA: Diagnosis not present

## 2014-10-15 DIAGNOSIS — M4120 Other idiopathic scoliosis, site unspecified: Secondary | ICD-10-CM

## 2014-10-15 DIAGNOSIS — G8929 Other chronic pain: Secondary | ICD-10-CM

## 2014-10-15 DIAGNOSIS — M549 Dorsalgia, unspecified: Secondary | ICD-10-CM

## 2014-10-15 LAB — COMPLETE METABOLIC PANEL WITH GFR
ALT: 13 U/L (ref 0–35)
AST: 13 U/L (ref 0–37)
Albumin: 4.5 g/dL (ref 3.5–5.2)
Alkaline Phosphatase: 52 U/L (ref 39–117)
BILIRUBIN TOTAL: 0.3 mg/dL (ref 0.2–1.2)
BUN: 20 mg/dL (ref 6–23)
CALCIUM: 9.9 mg/dL (ref 8.4–10.5)
CO2: 24 meq/L (ref 19–32)
Chloride: 108 mEq/L (ref 96–112)
Creat: 0.62 mg/dL (ref 0.50–1.10)
Glucose, Bld: 85 mg/dL (ref 70–99)
Potassium: 4.3 mEq/L (ref 3.5–5.3)
SODIUM: 140 meq/L (ref 135–145)
TOTAL PROTEIN: 7 g/dL (ref 6.0–8.3)

## 2014-10-15 LAB — CBC WITH DIFFERENTIAL/PLATELET
Basophils Absolute: 0 10*3/uL (ref 0.0–0.1)
Basophils Relative: 0 % (ref 0–1)
Eosinophils Absolute: 0 10*3/uL (ref 0.0–0.7)
Eosinophils Relative: 0 % (ref 0–5)
HCT: 37.6 % (ref 36.0–46.0)
HEMOGLOBIN: 12.3 g/dL (ref 12.0–15.0)
LYMPHS ABS: 1.9 10*3/uL (ref 0.7–4.0)
Lymphocytes Relative: 37 % (ref 12–46)
MCH: 29.8 pg (ref 26.0–34.0)
MCHC: 32.7 g/dL (ref 30.0–36.0)
MCV: 91 fL (ref 78.0–100.0)
MPV: 9 fL (ref 8.6–12.4)
Monocytes Absolute: 0.2 10*3/uL (ref 0.1–1.0)
Monocytes Relative: 4 % (ref 3–12)
Neutro Abs: 3 10*3/uL (ref 1.7–7.7)
Neutrophils Relative %: 59 % (ref 43–77)
Platelets: 353 10*3/uL (ref 150–400)
RBC: 4.13 MIL/uL (ref 3.87–5.11)
RDW: 15.1 % (ref 11.5–15.5)
WBC: 5 10*3/uL (ref 4.0–10.5)

## 2014-10-15 LAB — TSH: TSH: 1.483 u[IU]/mL (ref 0.350–4.500)

## 2014-10-15 MED ORDER — METHOCARBAMOL 500 MG PO TABS: ORAL_TABLET | ORAL | Status: AC

## 2014-10-15 MED ORDER — TRAMADOL HCL 50 MG PO TABS: 50.0000 mg | ORAL_TABLET | Freq: Four times a day (QID) | ORAL | Status: AC | PRN

## 2014-10-15 MED ORDER — TRIAMCINOLONE ACETONIDE 40 MG/ML IJ SUSP
40.0000 mg | Freq: Once | INTRAMUSCULAR | Status: AC
  Administered 2014-10-15: 40 mg via INTRAMUSCULAR

## 2014-10-15 NOTE — ED Provider Notes (Addendum)
CSN: [id number]     Arrival date & time 10/15/14  1405 History   First MD Initiated Contact with Patient 10/15/14 1543     Chief Complaint  Patient presents with  . Back Pain    HPI Comments: Patient presents to discuss her history of chronic back pain and scoliosis.  She admits that she has recently moved to the area and has been treated by a pain clinic.  She was evaluated in our clinic 10/07/14.  The history is provided by the patient.    Past Medical History  Diagnosis Date  . Scoliosis   . Back pain    Past Surgical History  Procedure Laterality Date  . Dilation and curettage of uterus     History reviewed. No pertinent family history. History  Substance Use Topics  . Smoking status: Former Smoker    Types: Cigarettes    Quit date: 12/14/2013  . Smokeless tobacco: Never Used  . Alcohol Use: No   OB History    No data available     Review of Systems See office notes dated 10/07/14 Allergies  Codeine sulfate; Demerol; Toradol; and Tramadol  Home Medications   Prior to Admission medications   Medication Sig Start Date End Date Taking? Authorizing Provider  HYDROcodone-acetaminophen (NORCO/VICODIN) 5-325 MG per tablet Take 1-2 tablets by mouth 3 (three) times daily after meals.   Yes Historical Provider, MD  diclofenac (VOLTAREN) 50 MG EC tablet Take 1 tablet (50 mg total) by mouth 2 (two) times daily as needed for moderate pain. 07/29/14   Rodolph BongEvan S Corey, MD  methocarbamol (ROBAXIN) 500 MG tablet Take one tab by mouth at bedtime for muscle spasm 10/15/14   Lattie HawStephen A Beese, MD  traMADol (ULTRAM) 50 MG tablet Take 1 tablet (50 mg total) by mouth every 6 (six) hours as needed. 10/15/14   Lattie HawStephen A Beese, MD   BP 112/78 mmHg  Pulse 75  Temp(Src) 98.6 F (37 C) (Oral)  Ht 5\' 4"  (1.626 m)  Wt 110 lb (49.896 kg)  BMI 18.87 kg/m2  SpO2 100%  LMP 10/14/2014 Physical Exam Nursing notes and Vital Signs reviewed. Appearance:  Patient appears stated age, and in no acute  distress Patient not examined otherwise (see previous note dated 10/07/14)  ED Course  Procedures   Labs Reviewed  VITAMIN D 25 HYDROXY  CBC WITH DIFFERENTIAL/PLATELET:  WBC 5.0; LY 37.4; MO 4.6; GR 58.0; Hgb 12.4; Platelets 310   COMPLETE METABOLIC PANEL WITH GFR  TSH      MDM   1. Chronic lower back pain   2. Chronic upper back pain   3. Idiopathic scoliosis    Discussed with patient treatment options for her chronic back pain. Kenalog 40mg  IM.  Rx for Tramadol 50mg  (small quantity:  #15, no refill.  Patient notes she can tolerate if taken with food) Trial of Robaxin at bedtime 500mg  Screening 25 hydroxy vitamin D level, CBC, TSH, CMP.  If Vitamin D level low, Schedule DEXA scan (if osteoporosis present her back pain may improve with a bisphosphonate) Will refer to PCP to establish care. [facility].    Lattie HawStephen A Beese, MD 10/19/14 2029  Lattie HawStephen A Beese, MD 10/19/14 2032

## 2014-10-15 NOTE — ED Notes (Signed)
Patient C/O chronic lower back pain times multiple years she states R/T Scoliosis. Patient advises that she was moving approximately 10 days ago and pain was worse. Rates pain 10/10.Last seen on 10/07/2014 for the same.

## 2014-10-15 NOTE — Discharge Instructions (Signed)
Take Tramadol with food to minimize nausea.

## 2014-10-16 LAB — VITAMIN D 25 HYDROXY (VIT D DEFICIENCY, FRACTURES): Vit D, 25-Hydroxy: 21 ng/mL — ABNORMAL LOW (ref 30–100)

## 2014-10-26 ENCOUNTER — Ambulatory Visit: Payer: Medicaid Other | Admitting: Family Medicine

## 2014-12-11 ENCOUNTER — Emergency Department: Payer: Self-pay

## 2014-12-11 ENCOUNTER — Emergency Department
Admission: EM | Admit: 2014-12-11 | Discharge: 2014-12-11 | Disposition: A | Discharge status: Home / Self Care | Payer: Self-pay | Attending: Emergency Medical Services | Admitting: Emergency Medical Services

## 2014-12-11 DIAGNOSIS — S39012A Strain of muscle, fascia and tendon of lower back, initial encounter: Secondary | ICD-10-CM

## 2014-12-11 DIAGNOSIS — T148 Other injury of unspecified body region: Secondary | ICD-10-CM | POA: Insufficient documentation

## 2014-12-11 DIAGNOSIS — S0083XA Contusion of other part of head, initial encounter: Secondary | ICD-10-CM | POA: Insufficient documentation

## 2014-12-11 DIAGNOSIS — X58XXXA Exposure to other specified factors, initial encounter: Secondary | ICD-10-CM | POA: Insufficient documentation

## 2014-12-11 MED ORDER — KETOROLAC TROMETHAMINE 10 MG PO TABS
10.0000 mg | ORAL_TABLET | Freq: Four times a day (QID) | ORAL | Status: AC | PRN
Start: 2014-12-11 — End: ?

## 2014-12-11 MED ORDER — DIAZEPAM 5 MG PO TABS
5.0000 mg | ORAL_TABLET | Freq: Four times a day (QID) | ORAL | Status: AC | PRN
Start: 2014-12-11 — End: ?

## 2014-12-11 MED ORDER — KETOROLAC TROMETHAMINE 30 MG/ML IJ SOLN
60.0000 mg | Freq: Once | INTRAMUSCULAR | Status: AC
Start: 2014-12-11 — End: 2014-12-11
  Administered 2014-12-11: 60 mg via INTRAMUSCULAR
  Filled 2014-12-11: qty 2

## 2014-12-11 MED ORDER — DIAZEPAM 5 MG PO TABS
5.0000 mg | ORAL_TABLET | Freq: Once | ORAL | Status: AC
Start: 2014-12-11 — End: 2014-12-11
  Administered 2014-12-11: 5 mg via ORAL
  Filled 2014-12-11: qty 1
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
## 2014-12-11 NOTE — Discharge Instructions (Signed)
f/u with Dr. Thedore Mins in 2-3 days for re-eval. return to ed if worsen or not better or have any acute concerns.       Back Pain NOS    You have been seen for back pain.    Back pain can happen anywhere from the neck down to the low back. Back pain has many different causes. Some of the more common are: Bone pain, muscle strain, muscle spasm, pain from overuse, and pinched nerves. Other problems can cause what feels like back pain. But the pain is really coming from another organ. A kidney infection can cause lower back pain.    Your doctor did not find any pain over the bones in your back (even though you might have pain in the muscles of the back). This means it is very unlikely that you have a broken bone (fracture) in your back. Your doctor did not think it was necessary to take an x-ray.    The doctor still does not know the exact cause of your pain. Your problem does not seem to be from a dangerous cause. It is OK for you to go home today.    Some things you can try to help your back feel better are:   Apply a warm damp washcloth to the back where you have pain for 20 minutes at a time, at least 4 times per day.   Have someone massage the sore parts of your back.   Don t do any heavy lifting or bending. You can go back to normal daily activities if they don t make the pain worse.   You can use anti-inflammatory pain medicine for your pain. This could be Ibuprofen (Advil or Motrin). You can buy these at most stores. Follow the directions on the package.    This pain may last for the next few days. If your pain gets better, you probably do not need to see a doctor. However, if your symptoms get worse or you have new symptoms, you should return here or go to the nearest Emergency Department.    Call your doctor or go to the nearest Emergency Department if you your pain does not improve within 4 weeks or your pain is bad enough to seriously limit your normal activities.    YOU SHOULD SEEK MEDICAL  ATTENTION IMMEDIATELY, EITHER HERE OR AT THE NEAREST EMERGENCY DEPARTMENT, IF ANY OF THE FOLLOWING OCCURS:   You think the pain is coming from somewhere other than your back. This can include chest pain. This is sometimes from angina (heart pains) or other dangerous causes.   You have shortness of breath, sweating, chest pain (or pressure, heaviness, indigestion, etc).   You have abdominal (belly) pain that goes through to your back.   Your arms and legs tingle or get numb (lose feeling).   Your arms or legs are weak.   You lose control of your bladder or bowels. If this were to happen, it may cause you to wet or soil yourself.   You have problems urinating (peeing).   You have fever (temperature higher than 100.51F / 38C).   Your pain gets worse.            Muscle Strain, General    You have been diagnosed with a muscle strain.    Any muscle in the body can be strained. A strain is an injury to muscles where some muscle fibers are injured by being stretched or partly torn. This usually happens from using  the muscle too much or from doing an activity the muscle is not used to.    Some strain symptoms are pain, muscle cramping and soreness to the touch.    Often, muscle pain and stiffness are worse the next day. This is much like what happens when someone starts exercising for the first time. After exercising, the person may feel pretty good. However, the next day all the exercised muscles are stiff and sore.    General strain treatment includes:   Resting the affected part.   Pain medicine.   Muscle relaxant medicines.   Warm compresses (such as a warm, moist towel).   Gently stretching the injured muscle.   When tolerated, gently massaging the injured area.    This injury is self-limited (it gets better on its own). It rarely needs specific treatment.    YOU SHOULD SEEK MEDICAL ATTENTION IMMEDIATELY, EITHER HERE OR AT THE NEAREST EMERGENCY DEPARTMENT, IF ANY OF THE FOLLOWING OCCURS:   Major  increase in swelling of the affected area.   Pain gets worse instead of gradually improving.   Skin gets red over the affected area.   Unable to use the affected limb. Limb weakness or numbness.

## 2014-12-14 ENCOUNTER — Emergency Department: Payer: Self-pay

## 2014-12-14 ENCOUNTER — Emergency Department
Admission: EM | Admit: 2014-12-14 | Discharge: 2014-12-14 | Disposition: A | Discharge status: Home / Self Care | Payer: Self-pay | Attending: Emergency Medicine | Admitting: Emergency Medicine

## 2014-12-14 DIAGNOSIS — M545 Low back pain, unspecified: Secondary | ICD-10-CM

## 2014-12-14 MED ORDER — HYDROCODONE-ACETAMINOPHEN 7.5-300 MG PO TABS
1.0000 | ORAL_TABLET | Freq: Four times a day (QID) | ORAL | Status: DC | PRN
Start: 2014-12-14 — End: 2017-10-08

## 2014-12-14 NOTE — Discharge Instructions (Signed)
Back Pain NOS     You have been seen for back pain.     Back pain can happen anywhere from the neck down to the low back. Back pain has many different causes. Some of the more common are: Bone pain, muscle strain, muscle spasm, pain from overuse, and pinched nerves. Other problems can cause what feels like back pain. But the pain is really coming from another organ. A kidney infection can cause lower back pain.     Your doctor did not find any pain over the bones in your back (even though you might have pain in the muscles of the back). This means it is very unlikely that you have a broken bone (fracture) in your back. Your doctor did not think it was necessary to take an x-ray.     The doctor still does not know the exact cause of your pain. Your problem does not seem to be from a dangerous cause. It is OK for you to go home today.     Some things you can try to help your back feel better are:  · Apply a warm damp washcloth to the back where you have pain for 20 minutes at a time, at least 4 times per day.  · Have someone massage the sore parts of your back.  · Don t do any heavy lifting or bending. You can go back to normal daily activities if they don t make the pain worse.  · You can use anti-inflammatory pain medicine for your pain. This could be Ibuprofen (Advil® or Motrin®). You can buy these at most stores. Follow the directions on the package.     This pain may last for the next few days. If your pain gets better, you probably do not need to see a doctor. However, if your symptoms get worse or you have new symptoms, you should return here or go to the nearest Emergency Department.     Call your doctor or go to the nearest Emergency Department if you your pain does not improve within 4 weeks or your pain is bad enough to seriously limit your normal activities.     YOU SHOULD SEEK MEDICAL ATTENTION IMMEDIATELY, EITHER HERE OR AT THE NEAREST EMERGENCY DEPARTMENT, IF ANY OF THE FOLLOWING OCCURS:  · You think  the pain is coming from somewhere other than your back. This can include chest pain. This is sometimes from angina (heart pains) or other dangerous causes.  · You have shortness of breath, sweating, chest pain (or pressure, heaviness, indigestion, etc).  · You have abdominal (belly) pain that goes through to your back.  · Your arms and legs tingle or get numb (lose feeling).  · Your arms or legs are weak.  · You lose control of your bladder or bowels. If this were to happen, it may cause you to wet or soil yourself.  · You have problems urinating (peeing).  · You have fever (temperature higher than 100.4ºF / 38ºC).  · Your pain gets worse.
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
## 2014-12-14 NOTE — ED Provider Notes (Signed)
ATTENDING : Helayne Seminole, MD. I HAVE PERSONALLY SEEN AND EXAMINED PATIENT. I have personally obtained a history, performed a focused PE, and participated in the medical decision making of this patient.     I have discussed and agree with the care plan of the Advanced Care Provider.    Brief Note - patient presents with acute on chronic lumbar spine pain that she has had long-standing.  No recent trauma.  No focal neurological deficits.  On physical examination she has paraspinal lower lumbar pain.  Reflexes are normal bilaterally.  No focal neurological deficits present.  Patient given 1 dose of pain medications with outpatient pain clinic follow-up.        Helayne Seminole, MD  12/14/14 [phone]

## 2015-01-02 ENCOUNTER — Emergency Department
Admission: EM | Admit: 2015-01-02 | Discharge: 2015-01-02 | Disposition: A | Discharge status: Home / Self Care | Payer: Self-pay | Attending: Emergency Medicine | Admitting: Emergency Medicine

## 2015-01-02 ENCOUNTER — Emergency Department: Payer: Self-pay

## 2015-01-02 DIAGNOSIS — M545 Low back pain, unspecified: Secondary | ICD-10-CM

## 2015-01-02 DIAGNOSIS — IMO0001 Reserved for inherently not codable concepts without codable children: Secondary | ICD-10-CM

## 2015-01-02 DIAGNOSIS — K0889 Other specified disorders of teeth and supporting structures: Secondary | ICD-10-CM

## 2015-01-02 DIAGNOSIS — K0381 Cracked tooth: Secondary | ICD-10-CM | POA: Insufficient documentation

## 2015-01-02 DIAGNOSIS — R030 Elevated blood-pressure reading, without diagnosis of hypertension: Secondary | ICD-10-CM | POA: Insufficient documentation

## 2015-01-02 HISTORY — DX: Anemia, unspecified: D64.9

## 2015-01-02 MED ORDER — LIDOCAINE 5 % EX PTCH
1.0000 | MEDICATED_PATCH | Freq: Every day | CUTANEOUS | Status: AC | PRN
Start: 2015-01-02 — End: ?
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
## 2015-01-02 NOTE — Discharge Instructions (Signed)
Back Pain, Lumbar NOS    You have been seen for low back pain.     This area is also called the lumbar spine.    Pain in the low back is very a common problem. This pain is usually due to overuse of the muscles, tension or a strain of the muscles. There can also be damage to the discs that cushion the bones in the spine. This can cause irritation of the nerves that exit the spinal canal in the low back.    Your doctor did not find any pain over the bones in your back (even though you might have pain in the back muscles). This means it is very unlikely that you have a broken bone in your back. Your doctor did not think it was necessary to take an x-ray.    The doctor still does not know the exact cause of your pain. Your problem does not seem to be from a dangerous cause. It is OK for you to go home today.    Some things you can try to help your back feel better are:   Apply a warm damp washcloth to the back for 20 minutes at a time, at least 4 times per day. This will reduce your pain. Massaging your back might also help.   Have someone massage the sore parts of your back.   Don t do any heavy lifting or bending. You can go back to normal daily activities if they don t make the pain worse.   Use the over-the-counter anti-inflammatory medication ibuprofen (also known as Advil or Motrin) as directed on the package to help with pain and inflammation.    It is normal for the pain to last for the next few days. If your pain gets better, you probably do not need to see a doctor. However, if your symptoms get worse or you have new symptoms, you should return here or go to the nearest Emergency Department.    Call your doctor or go to the nearest Emergency Department if you your pain does not improve or your pain is bad enough to seriously limit your normal activities.    YOU SHOULD SEEK MEDICAL ATTENTION IMMEDIATELY, EITHER HERE OR AT THE NEAREST EMERGENCY DEPARTMENT, IF ANY OF THE FOLLOWING OCCURS:   You  think the pain is coming from somewhere other than your back. This can include pelvic pain. This can be from infections in the pelvis or lower belly.   You have abdominal (belly) pain that goes through to your back.   Your legs tingle or get numb (lose feeling).   Your legs are weak.   You have fever (temperature higher than 100.81F / 38C) along with back pain.   Your back pain is getting worse.   You lose control of your bladder or bowels. If this were to happen, it may cause you to wet or soil yourself.   You have problems urinating (peeing).            Clinics: Dental    DENTAL RESOURCES      Theressa Stamps    [facility] - [phone]    [facility] - [phone]  [address] [place] [address]    [facility] [phone]  basic dental services, no restorative services available    [facility] - [phone]  requires screening by social service agency/income requirement      [facility]  [employer] [facility] - [phone]  [facility] [phone] [facility] [address]  annual fee $35.00 for adults and $15.00 for children under age of 49    [facility] - [phone]  [facility] - [phone]  [facility] - [phone]  [facility] - [phone]      [facility] [phone]  [facility] - [phone]      OTHER RESOURCES  [facility] [place]  [phone]    [facility]  [phone]

## 2015-01-02 NOTE — ED Notes (Signed)
Pt states she has left sided lower back pain without urinary symptoms. Pt also reports dental pain. Pt denies chest pain, sob

## 2015-01-02 NOTE — ED Notes (Signed)
Has had on and off left sided chest discomfort and some nausea. But has gotten more consistent.

## 2015-01-04 NOTE — ED Provider Notes (Signed)
I, Nita Sells, M.D., have personally seen and examined this patient and have fully participated in this patient's care.  I agree with all pertinent and available clinical information, including history, physical examination, assessment, and plan as documented by the [facility] except as noted.    On my history and physical, pt c/o chronic pain. Moved here one month ago. Ran out of pain meds. She said she got them refilled in the ER once. She has chronic dental and back pain. No sign of cauda equina nor infection. Chronically poor dentition without sign of abscess or acute infection. I explained why I cannot refill chronic pain meds in the ER. Referrals to pain management and dental clinics. Pt is well appearing.    Leticia Clas, MD  01/04/15 0900
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
